# Patient Record
Sex: Female | Born: 1950 | Race: White | Hispanic: No | Smoking: Former smoker
Health system: Southern US, Community
[De-identification: ages and names within clinical notes are randomized; demographics above are authoritative.]

## PROBLEM LIST (undated history)

## (undated) DIAGNOSIS — G4700 Insomnia, unspecified: Secondary | ICD-10-CM

## (undated) DIAGNOSIS — E7800 Pure hypercholesterolemia, unspecified: Secondary | ICD-10-CM

## (undated) DIAGNOSIS — F329 Major depressive disorder, single episode, unspecified: Secondary | ICD-10-CM

## (undated) DIAGNOSIS — F419 Anxiety disorder, unspecified: Secondary | ICD-10-CM

## (undated) DIAGNOSIS — K219 Gastro-esophageal reflux disease without esophagitis: Secondary | ICD-10-CM

## (undated) DIAGNOSIS — M4306 Spondylolysis, lumbar region: Secondary | ICD-10-CM

## (undated) DIAGNOSIS — R519 Headache, unspecified: Secondary | ICD-10-CM

## (undated) DIAGNOSIS — G8929 Other chronic pain: Secondary | ICD-10-CM

## (undated) DIAGNOSIS — M545 Low back pain, unspecified: Secondary | ICD-10-CM

## (undated) DIAGNOSIS — K5792 Diverticulitis of intestine, part unspecified, without perforation or abscess without bleeding: Secondary | ICD-10-CM

## (undated) HISTORY — PX: ROTATOR CUFF REPAIR: SHX139

## (undated) HISTORY — DX: Other chronic pain: G89.29

## (undated) HISTORY — DX: Spondylolysis, lumbar region: M43.06

## (undated) HISTORY — DX: Headache, unspecified: R51.9

## (undated) HISTORY — DX: Low back pain, unspecified: M54.50

## (undated) HISTORY — DX: Insomnia, unspecified: G47.00

## (undated) HISTORY — PX: TONSILLECTOMY: SUR1361

## (undated) HISTORY — DX: Major depressive disorder, single episode, unspecified: F32.9

## (undated) HISTORY — DX: Diverticulitis of intestine, part unspecified, without perforation or abscess without bleeding: K57.92

## (undated) HISTORY — DX: Gastro-esophageal reflux disease without esophagitis: K21.9

## (undated) HISTORY — DX: Anxiety disorder, unspecified: F41.9

## (undated) HISTORY — DX: Pure hypercholesterolemia, unspecified: E78.00

---

## 1984-06-12 HISTORY — PX: PLACEMENT OF BREAST IMPLANTS: SHX6334

## 1988-03-12 HISTORY — PX: ABDOMINAL HYSTERECTOMY: SHX81

## 2002-11-03 ENCOUNTER — Ambulatory Visit (HOSPITAL_BASED_OUTPATIENT_CLINIC_OR_DEPARTMENT_OTHER): Admission: RE | Admit: 2002-11-03 | Discharge: 2002-11-03 | Payer: Self-pay | Admitting: Family Medicine

## 2004-12-09 ENCOUNTER — Other Ambulatory Visit: Admission: RE | Admit: 2004-12-09 | Discharge: 2004-12-09 | Payer: Self-pay | Admitting: Family Medicine

## 2004-12-29 ENCOUNTER — Ambulatory Visit (HOSPITAL_COMMUNITY): Admission: RE | Admit: 2004-12-29 | Discharge: 2004-12-29 | Payer: Self-pay | Admitting: Gastroenterology

## 2004-12-29 ENCOUNTER — Encounter (INDEPENDENT_AMBULATORY_CARE_PROVIDER_SITE_OTHER): Payer: Self-pay | Admitting: *Deleted

## 2006-06-28 ENCOUNTER — Emergency Department (HOSPITAL_COMMUNITY): Admission: EM | Admit: 2006-06-28 | Discharge: 2006-06-29 | Payer: Self-pay | Admitting: Emergency Medicine

## 2007-06-18 ENCOUNTER — Ambulatory Visit (HOSPITAL_BASED_OUTPATIENT_CLINIC_OR_DEPARTMENT_OTHER): Admission: RE | Admit: 2007-06-18 | Discharge: 2007-06-18 | Payer: Self-pay | Admitting: Orthopaedic Surgery

## 2007-07-05 ENCOUNTER — Other Ambulatory Visit: Admission: RE | Admit: 2007-07-05 | Discharge: 2007-07-05 | Payer: Self-pay | Admitting: Family Medicine

## 2008-03-18 ENCOUNTER — Other Ambulatory Visit: Admission: RE | Admit: 2008-03-18 | Discharge: 2008-03-18 | Payer: Self-pay | Admitting: Obstetrics and Gynecology

## 2008-06-12 HISTORY — PX: ABDOMINOPLASTY: SUR9

## 2010-10-25 NOTE — Op Note (Signed)
[name], Destiny Duffy               ACCOUNT NO.:  [id number]   MEDICAL RECORD NO.:  [id number]          PATIENT TYPE:  AMB   LOCATION:  DSC                          FACILITY:  [facility]   PHYSICIAN:  Lubertha Basque. Dalldorf, M.D.DATE OF BIRTH:  [birth year]   DATE OF PROCEDURE:  06/18/2007  DATE OF DISCHARGE:                               OPERATIVE REPORT   PREOPERATIVE DIAGNOSES:  1. Right shoulder impingement.  2. Right shoulder acromioclavicular degeneration.   POSTOPERATIVE DIAGNOSES:  1. Right shoulder impingement.  2. Right shoulder acromioclavicular degeneration.   PROCEDURE:  1. Right shoulder arthroscopic acromioplasty.  2. Right shoulder arthroscopic acromioclavicular resection.  3. Right shoulder arthroscopic debridement.   ANESTHESIA:  General and block.   ATTENDING SURGEON:  Lubertha Basque. Jerl Santos, M.D.   ASSISTANT:  Lindwood Qua, P.A.   INDICATIONS FOR PROCEDURE:  The patient is a 60 year old woman with a  very long history of right dominant shoulder pain.  This stems from a  work related injury.  She has been treated with injections and therapy  and activity restriction but persists with difficulty.  By MRI scan she  has some intrasubstance partial-thickness tearing of the cuff and things  consistent with impingement.  She has pain which limits her ability to  use this arm and to rest and she is offered an arthroscopy.  Informed  operative consent was obtained after discussion of possible  complications of reaction to anesthesia and infection.   SUMMARY OF FINDINGS AND PROCEDURE:  Under general anesthesia and a block  an arthroscopy right shoulder was performed.  The glenohumeral joint  showed no degenerative changes and the biceps tendon appeared normal.  The rotator cuff had some partial-thickness tearing seen from below.  In  the subacromial space the rotator cuff appeared benign after a thorough  bursectomy.  She did have a prominent subacromial morphology addressed  with acromioplasty back to a flat surface.  There was also bone-on-bone  contact at the [facility] joint and this was addressed with a formal AC  decompression.   DESCRIPTION OF PROCEDURE:  The patient was taken to the operating suite  where general anesthetic was applied without difficulty.  She was  positioned in beach-chair position and prepped, draped in normal sterile  fashion.  After the administration of preop IV Kefzol an arthroscopy  right shoulder was performed through a total of three portals.  Findings  were as noted above and procedure consisted of acromioplasty, followed  by [facility] resection.  We performed the acromioplasty with the bur in the  lateral position following transfer of the bur to posterior position.  The [facility] resection was performed through lateral and anterior portals.  I  debrided the partial-thickness cuff tearing seen from below.  A thorough  bursectomy was done but no significant cuff tear could be found.  The  shoulder was thoroughly irrigated followed by placement of some simple  sutures to reapproximate portals loosely.  Adaptic was applied followed  by dry gauze and tape.  Estimated blood loss and intraoperative fluids  can be obtained from anesthesia records.   DISPOSITION:  The patient  was extubated in the operating room and taken  to recovery in stable addition.  She was to go home same-day and follow  up in the office in less than a week.  I will contact her by phone  tonight.      Lubertha Basque Jerl Santos, M.D.  Electronically Signed     PGD/MEDQ  D:  06/18/2007  T:  06/18/2007  Job:  [id number]

## 2010-10-28 NOTE — Op Note (Signed)
NAMECHELE, CORNELL               ACCOUNT NO.:  [id number]   MEDICAL RECORD NO.:  [id number]          PATIENT TYPE:  AMB   LOCATION:  ENDO                         FACILITY[facility]   PHYSICIAN:  Petra Kuba, M.D.    DATE OF BIRTH:  Sep 10, 1950   DATE OF PROCEDURE:  12/29/2004  DATE OF DISCHARGE:                                 OPERATIVE REPORT   PROCEDURE:  Colonoscopy.   ENDOSCOPIST:  Petra Kuba, M.D.   INDICATIONS:  Father with colon polyps.  Due for colonic screening. Consent  signed after risks, benefits, methods, options thoroughly discussed per  nurse in the office.   MEDICINES USED:  Demerol 80 mg, Versed 8 mg.   DESCRIPTION OF PROCEDURE:  Rectal inspection is pertinent for external  hemorrhoids.  Small digital exam was negative. Video pediatric adjustable  colonoscope was inserted inferiorly and easily advanced around the colon to  the cecum.  This did require some abdominal pressure but no position  changes.  On insertion, some left-sided diverticula were seen but no other  abnormalities.  The cecum was identified by the appendiceal orifice and  ileocecal valve.  Scope was slowly withdrawn.  The prep was adequate.  There  was some liquid stool that required washing and suctioning.  On slow  withdrawal through the colon, the cecum and descending were normal.  In the  more distal transverse, two tiny polyps were seen were hot biopsied x1.  A  small slightly pedunculated polyp was seen and snare electrocautery applied  and the polyp was suctioned through the scope and collected in the trap.  This was put in the same container with the other two 32 hot biopsies and  just around the splenic flexure in the proximal descending another tiny  polyp seen and was hot biopsied and put in the same container.  The scope  was withdrawn.  Diverticula were confirmed on the left side. In the distal  sigmoid and rectum, a few probable hyperplastic polyps were seen.  A few  were hot  biopsied.  A couple were cold biopsied and were all put in the  second container.  Anorectal pull-through and retroflexion confirmed some  small hemorrhoids. The scope was straightened and readvanced a short ways up  the left side of the colon.  Air was suctioned and scope removed.  The  patient tolerated the procedure well.  There was no obvious immediate  complication.   ENDOSCOPIC DIAGNOSIS:  1.  Internal and external hemorrhoids.  2.  Left-sided diverticula.  3.  Rectal and distal sigmoid hyperplastic appearing polyps hot and cold      biopsied.  4.  Three tiny to small distal transverse proximal descending polyps hot      biopsied and small distal transverse polyp snared.  5.  Otherwise within normal limits to the cecum.   PLAN:  Await pathology to determine future colonic screening.  Happy to see  back p.r.n.  Otherwise return care to Dr. _______ for the customary health  care maintenance to include yearly rectal as guaiacs.       MEM/MEDQ  D:  12/29/2004  T:  12/29/2004  Job:  [id number]   cc:   Sigmund Hazel, M.D.  [address]  [address] [place], [place] [address]  Fax: [phone]

## 2017-01-29 DIAGNOSIS — M545 Low back pain: Secondary | ICD-10-CM | POA: Diagnosis not present

## 2017-02-20 DIAGNOSIS — K219 Gastro-esophageal reflux disease without esophagitis: Secondary | ICD-10-CM | POA: Diagnosis not present

## 2017-02-20 DIAGNOSIS — M5441 Lumbago with sciatica, right side: Secondary | ICD-10-CM | POA: Diagnosis not present

## 2017-02-20 DIAGNOSIS — F339 Major depressive disorder, recurrent, unspecified: Secondary | ICD-10-CM | POA: Diagnosis not present

## 2017-02-20 DIAGNOSIS — F5101 Primary insomnia: Secondary | ICD-10-CM | POA: Diagnosis not present

## 2017-03-05 DIAGNOSIS — M7061 Trochanteric bursitis, right hip: Secondary | ICD-10-CM | POA: Diagnosis not present

## 2017-03-14 DIAGNOSIS — M7061 Trochanteric bursitis, right hip: Secondary | ICD-10-CM | POA: Diagnosis not present

## 2017-03-26 DIAGNOSIS — Z0000 Encounter for general adult medical examination without abnormal findings: Secondary | ICD-10-CM | POA: Diagnosis not present

## 2017-03-27 DIAGNOSIS — M545 Low back pain: Secondary | ICD-10-CM | POA: Diagnosis not present

## 2017-04-02 DIAGNOSIS — F339 Major depressive disorder, recurrent, unspecified: Secondary | ICD-10-CM | POA: Diagnosis not present

## 2017-04-02 DIAGNOSIS — K219 Gastro-esophageal reflux disease without esophagitis: Secondary | ICD-10-CM | POA: Diagnosis not present

## 2017-04-02 DIAGNOSIS — F5101 Primary insomnia: Secondary | ICD-10-CM | POA: Diagnosis not present

## 2017-04-02 DIAGNOSIS — M5441 Lumbago with sciatica, right side: Secondary | ICD-10-CM | POA: Diagnosis not present

## 2017-04-04 DIAGNOSIS — H25012 Cortical age-related cataract, left eye: Secondary | ICD-10-CM | POA: Diagnosis not present

## 2017-04-04 DIAGNOSIS — H52203 Unspecified astigmatism, bilateral: Secondary | ICD-10-CM | POA: Diagnosis not present

## 2017-04-04 DIAGNOSIS — H2511 Age-related nuclear cataract, right eye: Secondary | ICD-10-CM | POA: Diagnosis not present

## 2017-04-04 DIAGNOSIS — H5213 Myopia, bilateral: Secondary | ICD-10-CM | POA: Diagnosis not present

## 2017-04-06 DIAGNOSIS — M7061 Trochanteric bursitis, right hip: Secondary | ICD-10-CM | POA: Diagnosis not present

## 2018-06-12 HISTORY — PX: ESOPHAGOGASTRODUODENOSCOPY: SHX1529

## 2018-06-12 HISTORY — PX: COLONOSCOPY: SHX174

## 2019-04-17 ENCOUNTER — Encounter: Payer: Self-pay | Admitting: *Deleted

## 2019-04-21 ENCOUNTER — Encounter: Payer: Self-pay | Admitting: Neurology

## 2019-04-21 ENCOUNTER — Ambulatory Visit: Payer: Self-pay | Admitting: Neurology

## 2019-06-18 ENCOUNTER — Ambulatory Visit: Payer: Medicare Other | Admitting: Neurology

## 2019-07-14 ENCOUNTER — Telehealth (INDEPENDENT_AMBULATORY_CARE_PROVIDER_SITE_OTHER): Payer: Self-pay | Admitting: Neurology

## 2019-07-14 DIAGNOSIS — Z5329 Procedure and treatment not carried out because of patient's decision for other reasons: Secondary | ICD-10-CM

## 2019-07-14 NOTE — Progress Notes (Signed)
No-showed appointment with Dr. Lucia Gaskins (and previously with Dr. Epimenio Foot)

## 2019-07-15 ENCOUNTER — Encounter: Payer: Self-pay | Admitting: Neurology

## 2019-10-08 ENCOUNTER — Ambulatory Visit (INDEPENDENT_AMBULATORY_CARE_PROVIDER_SITE_OTHER): Payer: Medicare Other | Admitting: Otolaryngology

## 2019-10-08 ENCOUNTER — Other Ambulatory Visit: Payer: Self-pay

## 2019-10-08 ENCOUNTER — Encounter (INDEPENDENT_AMBULATORY_CARE_PROVIDER_SITE_OTHER): Payer: Self-pay | Admitting: Otolaryngology

## 2019-10-08 VITALS — Temp 97.3°F

## 2019-10-08 DIAGNOSIS — J310 Chronic rhinitis: Secondary | ICD-10-CM | POA: Diagnosis not present

## 2019-10-08 DIAGNOSIS — J342 Deviated nasal septum: Secondary | ICD-10-CM

## 2019-10-08 NOTE — Progress Notes (Signed)
HPI: Destiny Duffy is a 69 y.o. female who presents is referred by her PCP for evaluation of chronic nasal sinus symptoms that she has had for years.  She complains of chronic nasal obstruction as well as a lot of drainage from her nose.  The nasal congestion comes and goes.  She also complains of pressure in her sinuses that comes and goes.  She has not seen an allergist recently.  She also complains of congestion in her ears.  She is presently using Flonase in the mornings and Claritin..  She is moderately congested today.  Past Medical History:  Diagnosis Date  . Anxiety   . Chronic low back pain   . Diverticulitis   . GERD (gastroesophageal reflux disease)   . Headache   . Hypercholesteremia   . Insomnia   . Lumbar spondylolysis   . Major depression, chronic     Social History   Socioeconomic History  . Marital status: Divorced    Spouse name: Not on file  . Number of children: Not on file  . Years of education: Not on file  . Highest education level: Not on file  Occupational History  . Not on file  Tobacco Use  . Smoking status: Former Smoker    Packs/day: 0.50    Years: 37.00    Pack years: 18.50    Types: Cigarettes    Start date: 16    Quit date: 04/30/2006    Years since quitting: 13.4  . Smokeless tobacco: Never Used  Substance and Sexual Activity  . Alcohol use: Yes  . Drug use: Not on file  . Sexual activity: Not on file  Other Topics Concern  . Not on file  Social History Narrative  . Not on file   Social Determinants of Health   Financial Resource Strain:   . Difficulty of Paying Living Expenses:   Food Insecurity:   . Worried About Programme researcher, broadcasting/film/video in the Last Year:   . Barista in the Last Year:   Transportation Needs:   . Freight forwarder (Medical):   Marland Kitchen Lack of Transportation (Non-Medical):   Physical Activity:   . Days of Exercise per Week:   . Minutes of Exercise per Session:   Stress:   . Feeling of Stress :   Social  Connections:   . Frequency of Communication with Friends and Family:   . Frequency of Social Gatherings with Friends and Family:   . Attends Religious Services:   . Active Member of Clubs or Organizations:   . Attends Banker Meetings:   Marland Kitchen Marital Status:    Family History  Problem Relation Age of Onset  . Heart disease Brother   . Diabetes Maternal Grandmother    Allergies  Allergen Reactions  . Aspirin Nausea And Vomiting  . Codeine Nausea And Vomiting   Prior to Admission medications   Medication Sig Start Date End Date Taking? Authorizing Provider  baclofen (LIORESAL) 10 MG tablet Take 10 mg by mouth 3 (three) times daily.   Yes [provider]  DULoxetine (CYMBALTA) 60 MG capsule Take 60 mg by mouth daily.   Yes [provider]  Eszopiclone 3 MG TABS Take 3 mg by mouth at bedtime. Take immediately before bedtime   Yes [provider]  fluticasone (FLONASE) 50 MCG/ACT nasal spray Place into both nostrils daily.   Yes [provider]  gabapentin (NEURONTIN) 100 MG capsule Take 100 mg by mouth  3 (three) times daily.   Yes [provider]  omeprazole (PRILOSEC) 40 MG capsule Take 40 mg by mouth 2 (two) times daily.   Yes [provider]  valACYclovir (VALTREX) 1000 MG tablet Take 1,000 mg by mouth as needed. X 1 dose   Yes [provider]  vitamin B-12 (CYANOCOBALAMIN) 500 MCG tablet Take 500 mcg by mouth daily.   Yes [provider]     Positive ROS: Otherwise negative  All other systems have been reviewed and were otherwise negative with the exception of those mentioned in the HPI and as above.  Physical Exam: Constitutional: Alert, well-appearing, no acute distress Ears: External ears without lesions or tenderness.  Ear canals are clear bilaterally.  TMs are clear bilaterally with good mobility on pneumatic otoscopy and no middle ear effusion noted. Nasal: External nose without lesions.  Septum is deviated to the left.  Mild rhinitis bilaterally with clear mucus discharge.  Both middle meatus regions are clear with no evidence of active infection.  No polyps noted..  Oral: Lips and gums without lesions. Tongue and palate mucosa without lesions. Posterior oropharynx clear. Neck: No palpable adenopathy or masses Respiratory: Breathing comfortably  Skin: No facial/neck lesions or rash noted.  Procedures  Assessment: Chronic rhinitis with probable allergic rhinitis. 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 Clear ears on exam.  Plan: Recommended use of Flonase or Nasacort 2 sprays each nostril at night. Also prescribed use of azelastine 1 spray each nostril twice daily and saline rinses during the daytime as needed nasal drainage and suggested trying the xlear brand. If she does not get adequate relief with maximal medical therapy could consider surgical options. She will call us back in 1 month if she continues to have persistent problems. She might also benefit by seeing an allergist and suggested allergy and asthma clinic.   Radene Journey, MD   CC:

## 2019-10-17 ENCOUNTER — Ambulatory Visit (INDEPENDENT_AMBULATORY_CARE_PROVIDER_SITE_OTHER): Payer: Medicare Other | Admitting: Allergy

## 2019-10-17 ENCOUNTER — Other Ambulatory Visit: Payer: Self-pay

## 2019-10-17 ENCOUNTER — Telehealth: Payer: Self-pay | Admitting: Allergy

## 2019-10-17 ENCOUNTER — Encounter: Payer: Self-pay | Admitting: Allergy

## 2019-10-17 VITALS — BP 110/70 | HR 75 | Temp 97.6°F | Resp 16 | Ht 65.0 in | Wt 194.4 lb

## 2019-10-17 DIAGNOSIS — H1013 Acute atopic conjunctivitis, bilateral: Secondary | ICD-10-CM | POA: Diagnosis not present

## 2019-10-17 DIAGNOSIS — J310 Chronic rhinitis: Secondary | ICD-10-CM | POA: Diagnosis not present

## 2019-10-17 MED ORDER — XHANCE 93 MCG/ACT NA EXHU: INHALANT_SUSPENSION | NASAL | 5 refills | Status: AC

## 2019-10-17 NOTE — Patient Instructions (Addendum)
 -  Environmental allergy skin testing is non-reactive today thus will obtain environmental panel via blood work today  -Stop Claritin.  Recommend use of long-acting antihistamine Zyrtec 10 mg, Xyzal 5 mg or Allegra 180 mg daily.  -Continue nasal antihistamine, Astelin 2 sprays each nostril twice a day at this time  -We will have you try XHANCE nasal spray device.  This nasal spray contains fluticasone which is found in Flonase however is a higher strength.  XHANCE allows for deeper deposition of the medications into your sinuses for better impact and control of your congestion as well as ear and headache symptoms.  Use XHANCE 2 sprays each nostril twice a day at this time.  -Continue to perform nasal saline rinses as much as possible.  Use either distilled water or boiled water and bring down to room temperature prior to use.  -For itchy, watery eyes can use over-the-counter Pataday or Pataday extra strength 1 drop each eye daily as needed  -If the above medications are not effective enough consider adding Singulair  -If medication management is not effective then consider allergen immunotherapy (pending sensitivity on lab work).  Follow-up in 3 to 4 months or sooner if needed

## 2019-10-17 NOTE — Telephone Encounter (Signed)
Patient called and wants Xhance sent to her local pharmacy, [facility] on [address] in [place] instead of [employer].  Please advise.

## 2019-10-17 NOTE — Progress Notes (Signed)
New Patient Note  RE: CINTIA GLEED MRN: [id number] DOB: 09-19-1950 Date of Office Visit: 10/17/2019  Referring provider: Dillard Cannon, MD; Worthy Rancher, MD Primary care provider: Worthy Rancher, MD  Chief Complaint: allergies  History of present illness: KALEB LINQUIST is a 69 y.o. female presenting today for consultation for chronic rhinitis.   She reports year-round symptoms of feeling "clogged up", runny nose, nasal congestion, headaches, ears clogged, pressure behind eyes, itchy/watery eyes, sneezing, throat clearing.   She has tried Claritin daily for past 8 months, Flonase 1 spray each nostril at night for the past 8 months, Astelin 1 spray each nostril twice a day for couple days (she just received this nasal spray).  She has performed nasal saline rinses about once week on average and reports is not a big fan of this technique.  She states she feels the same on and off the Claritin for past 3 days.   She has seen Dr. Ezzard Standing with ENT for chronic rhinitis with most recent visit on 10/08/2019.  On exam she was noted to have septal deviation to the left.  She was recommended to use Flonase or Nasacort as well as azelastine and to perform saline rinses.  It was recommended if she does not have adequate relief with medical therapy that she could consider surgical options.  No history of eczema, food allergy or asthma.   Review of systems: Review of Systems  Constitutional: Negative.   HENT:       See HPI  Eyes:       See HPI  Respiratory: Negative.   Cardiovascular: Negative.   Gastrointestinal: Negative.   Musculoskeletal: Negative.   Skin: Negative.   Neurological:       See HPI    All other systems negative unless noted above in HPI  Past medical history: Past Medical History:  Diagnosis Date  . Anxiety   . Chronic low back pain   . Diverticulitis   . GERD (gastroesophageal reflux disease)   . Headache   . Hypercholesteremia   . Insomnia   .  Lumbar spondylolysis   . Major depression, chronic     Past surgical history: Past Surgical History:  Procedure Laterality Date  . ABDOMINAL HYSTERECTOMY  03/1988  . ABDOMINOPLASTY  2010  . COLONOSCOPY  2020  . ESOPHAGOGASTRODUODENOSCOPY  2020  . PLACEMENT OF BREAST IMPLANTS Bilateral 1986  . ROTATOR CUFF REPAIR  2007.2009  . TONSILLECTOMY      Family history:  Family History  Problem Relation Age of Onset  . Heart disease Brother   . Diabetes Maternal Grandmother     Social history: Lives in an apartment with carpeting in bedroom with dog and cat in the home.  No concern for water damage, mildew or roaches in the home.  Does not work currently.   Socioeconomic History  . Marital status: Divorced  Occupational History  Tobacco Use  . Smoking status: Former Smoker    Packs/day: 0.50    Years: 37.00    Pack years: 18.50    Types: Cigarettes    Start date: 84    Quit date: 04/30/2006    Years since quitting: 13.4  . Smokeless tobacco: Never Used    Medication List: Current Outpatient Medications  Medication Sig Dispense Refill  . azelastine (ASTELIN) 0.1 % nasal spray Place 2 sprays into both nostrils 2 (two) times daily.    . baclofen (LIORESAL) 10 MG tablet Take 10 mg by mouth 3 (three)  times daily.    . DULoxetine (CYMBALTA) 60 MG capsule Take 60 mg by mouth daily.    . Eszopiclone 3 MG TABS Take 3 mg by mouth at bedtime. Take immediately before bedtime    . fluticasone (FLONASE) 50 MCG/ACT nasal spray Place into both nostrils daily.    Marland Kitchen gabapentin (NEURONTIN) 100 MG capsule Take 100 mg by mouth 3 (three) times daily.    Marland Kitchen omeprazole (PRILOSEC) 40 MG capsule Take 40 mg by mouth 2 (two) times daily.    . valACYclovir (VALTREX) 1000 MG tablet Take 1,000 mg by mouth as needed. X 1 dose    . vitamin B-12 (CYANOCOBALAMIN) 500 MCG tablet Take 500 mcg by mouth daily.     No current facility-administered medications for this visit.    Known medication  allergies: Allergies  Allergen Reactions  . Aspirin Nausea And Vomiting  . Codeine Nausea And Vomiting     Physical examination: Blood pressure 110/70, pulse 75, temperature 97.6 F (36.4 C), temperature source Temporal, resp. rate 16, height 5\' 5"  (1.651 m), weight 194 lb 6.4 oz (88.2 kg), SpO2 95 %.  General: Alert, interactive, in no acute distress. HEENT: PERRLA, TMs pearly gray, turbinates moderately edematous with clear discharge with leftward deviation of the septum, post-pharynx non erythematous. Neck: Supple without lymphadenopathy. Lungs: Clear to auscultation without wheezing, rhonchi or rales. {no increased work of breathing. CV: Normal S1, S2 without murmurs. Abdomen: Nondistended, nontender. Skin: Warm and dry, without lesions or rashes. Extremities:  No clubbing, cyanosis or edema. Neuro:   Grossly intact.  Diagnositics/Labs:  Allergy testing: Environmental allergy skin prick testing is non-reactive including histamine. Allergy testing results were read and interpreted by provider, documented by clinical staff.   Assessment and plan:   Rhinitis with conjunctivitis, presumed allergic  -Environmental allergy skin testing is non-reactive today thus will obtain environmental panel via blood work today  -Stop Claritin.  Recommend use of long-acting antihistamine Zyrtec 10 mg, Xyzal 5 mg or Allegra 180 mg daily.  -Continue nasal antihistamine, Astelin 2 sprays each nostril twice a day at this time  -We will have you try XHANCE nasal spray device.  This nasal spray contains fluticasone which is found in Flonase however is a higher strength.  XHANCE allows for deeper deposition of the medications into your sinuses for better impact and control of your congestion as well as ear and headache symptoms.  Use XHANCE 2 sprays each nostril twice a day at this time.  -Continue to perform nasal saline rinses as much as possible.  Use either distilled water or boiled water and bring  down to room temperature prior to use.  -For itchy, watery eyes can use over-the-counter Pataday or Pataday extra strength 1 drop each eye daily as needed  -If the above medications are not effective enough consider adding Singulair  -If medication management is not effective then consider allergen immunotherapy (pending sensitivity on lab work).    Follow-up in 3 to 4 months or sooner if needed  I appreciate the opportunity to take part in Aston's care. Please do not hesitate to contact me with questions.  Sincerely,   Prudy Feeler, MD Allergy/Immunology [facility]

## 2019-10-17 NOTE — Telephone Encounter (Signed)
Called patient back and explained the reason why we send Xhance to [facility]. Patient verbalized understanding.

## 2019-10-23 LAB — ALLERGENS W/TOTAL IGE AREA 2

## 2019-11-04 ENCOUNTER — Other Ambulatory Visit (INDEPENDENT_AMBULATORY_CARE_PROVIDER_SITE_OTHER): Payer: Self-pay

## 2019-11-04 DIAGNOSIS — J3489 Other specified disorders of nose and nasal sinuses: Secondary | ICD-10-CM

## 2019-11-20 ENCOUNTER — Ambulatory Visit
Admission: RE | Admit: 2019-11-20 | Discharge: 2019-11-20 | Disposition: A | Discharge status: Home / Self Care | Payer: Medicare Other | Source: Ambulatory Visit | Attending: Otolaryngology | Admitting: Otolaryngology

## 2019-11-20 ENCOUNTER — Other Ambulatory Visit: Payer: Self-pay

## 2019-11-20 DIAGNOSIS — J3489 Other specified disorders of nose and nasal sinuses: Secondary | ICD-10-CM

## 2019-11-26 ENCOUNTER — Other Ambulatory Visit (INDEPENDENT_AMBULATORY_CARE_PROVIDER_SITE_OTHER): Payer: Self-pay

## 2020-01-20 NOTE — Telephone Encounter (Signed)
Prior authorization for Xhance submitted on covermymeds. Currently pending approval/denial.  

## 2020-01-21 NOTE — Telephone Encounter (Signed)
Bummer.  Can we provide with samples?

## 2020-01-21 NOTE — Telephone Encounter (Signed)
Patient's insurance denied coverage of Xhance. We will need to switch to something like Flonase, azelastine, Nasonex, etc?

## 2020-01-21 NOTE — Telephone Encounter (Signed)
I could get her a couple of weeks worth.

## 2020-02-23 ENCOUNTER — Other Ambulatory Visit (INDEPENDENT_AMBULATORY_CARE_PROVIDER_SITE_OTHER): Payer: Self-pay | Admitting: Otolaryngology

## 2021-04-19 IMAGING — CT CT MAXILLOFACIAL W/O CM
1 series · 15 of 30 positions shown, 19 images · non-contrast
Comparison: Head CT 03/03/2019.

CLINICAL DATA: 69-year-old female with chronic or recurrent sinus
issues, progressed from last year. Nasal obstruction.

EXAM:
CT MAXILLOFACIAL WITHOUT CONTRAST
TECHNIQUE: Multidetector CT images of the paranasal sinuses were obtained using
the standard protocol without intravenous contrast.

[Series 4: soft tissue · axial · 0.40mm/px · z∈[+732,+837]mm · 15 of 113 slices shown, 19 images]
[im 4/113  brain]
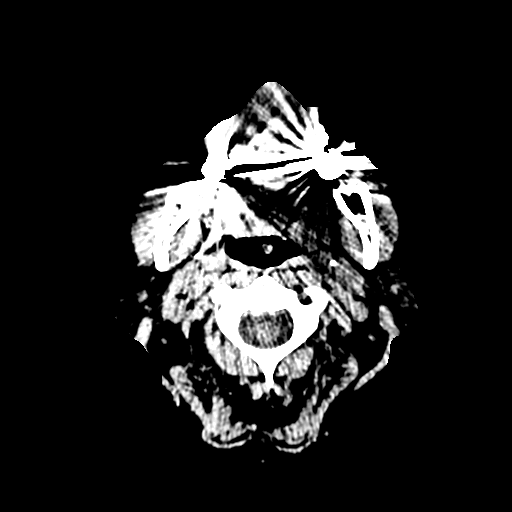
[im 4/113  bone]
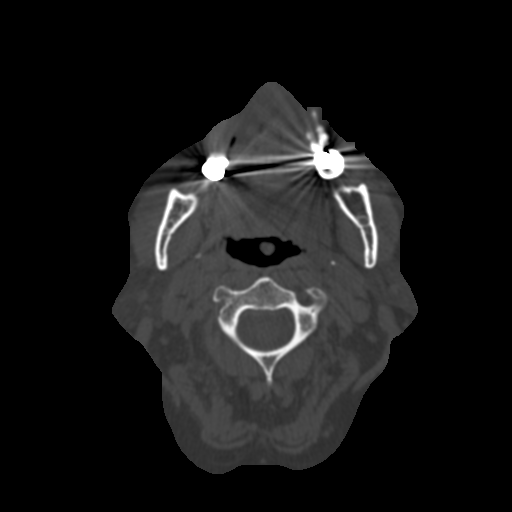
[im 12/113  bone]
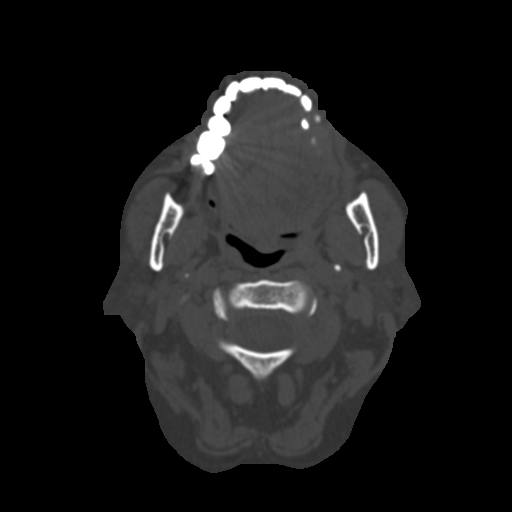
[im 20/113  bone]
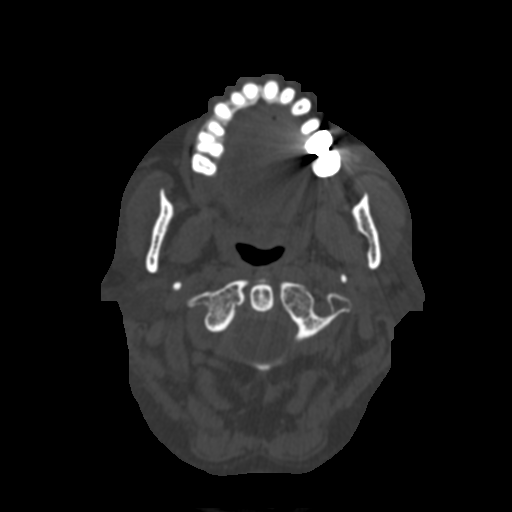
[im 28/113  bone]
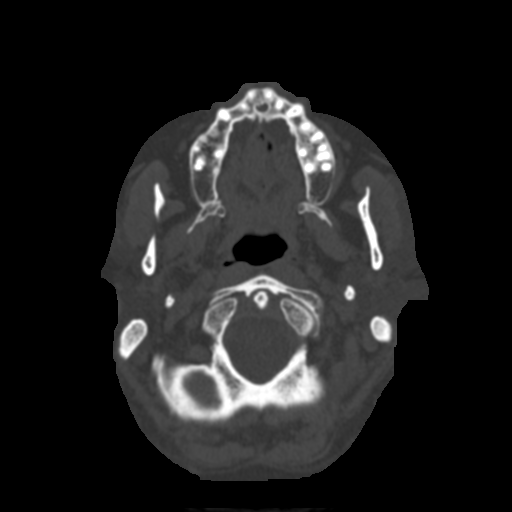
[im 35/113  brain]
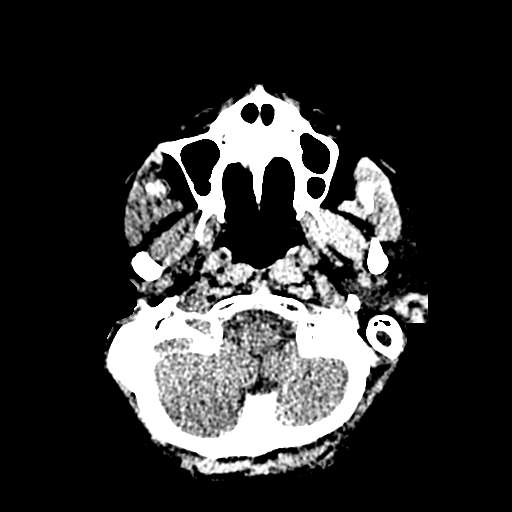
[im 35/113  bone]
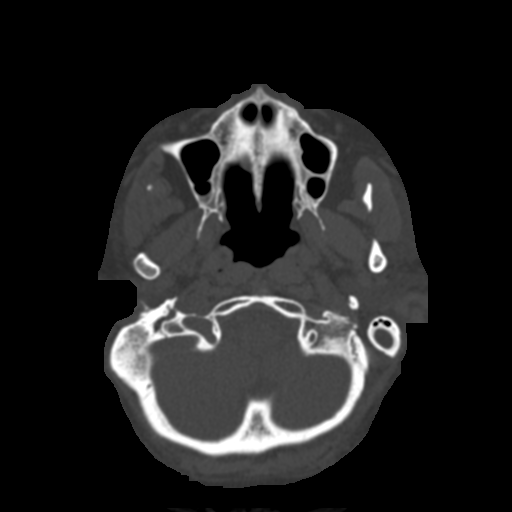
[im 43/113  bone]
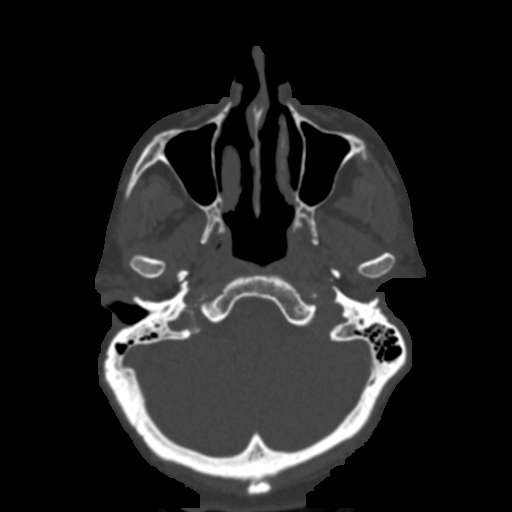
[im 51/113  bone]
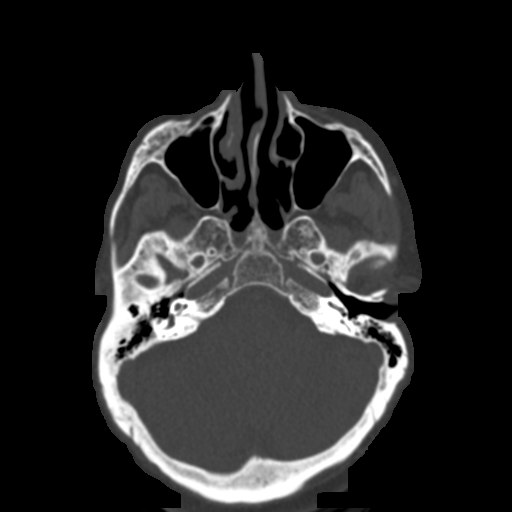
[im 58/113  bone]
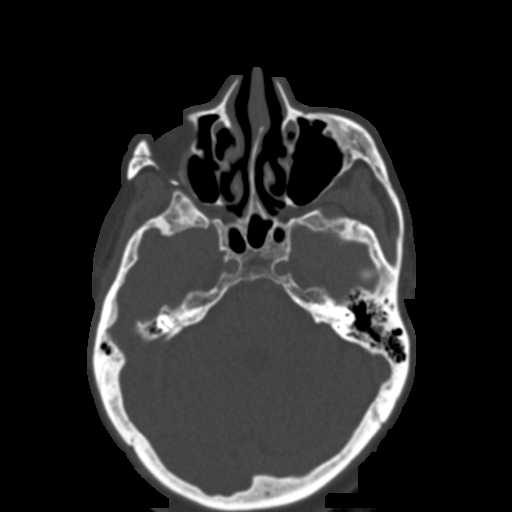
[im 62/113  brain]
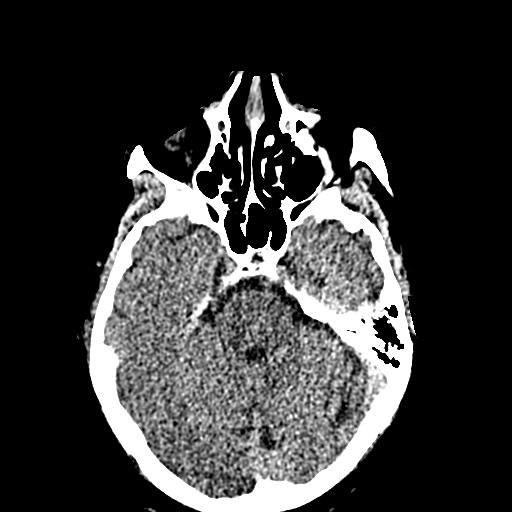
[im 62/113  bone]
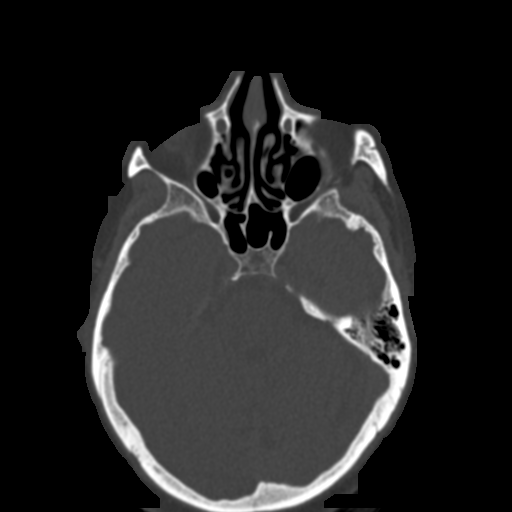
[im 70/113  bone]
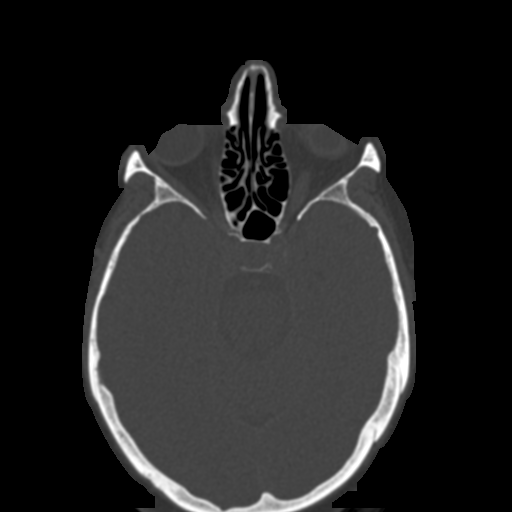
[im 78/113  bone]
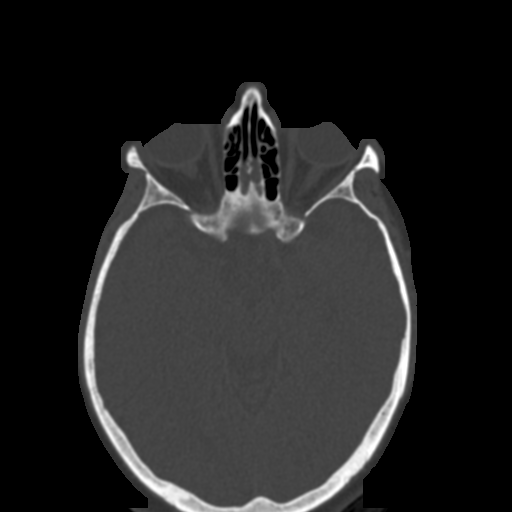
[im 85/113  bone]
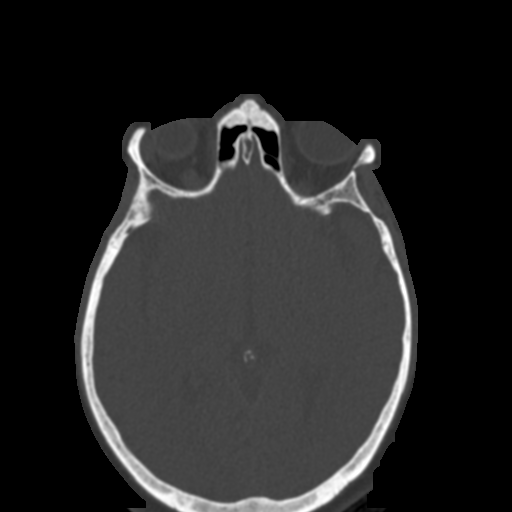
[im 93/113  brain]
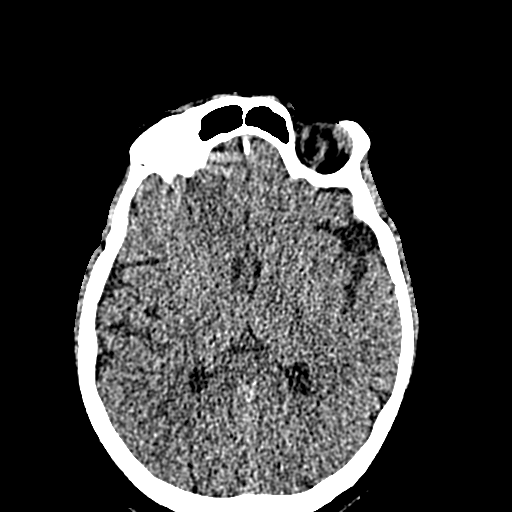
[im 93/113  bone]
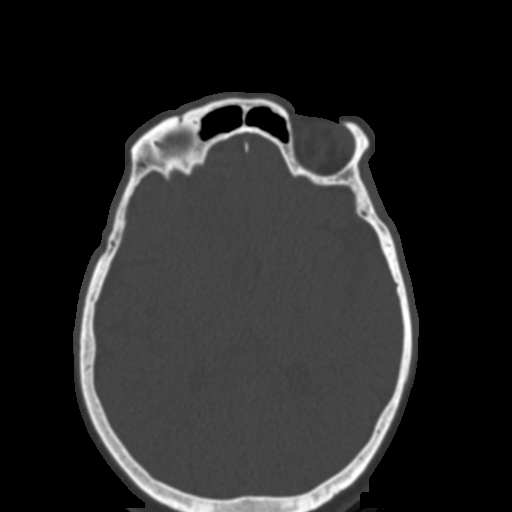
[im 101/113  bone]
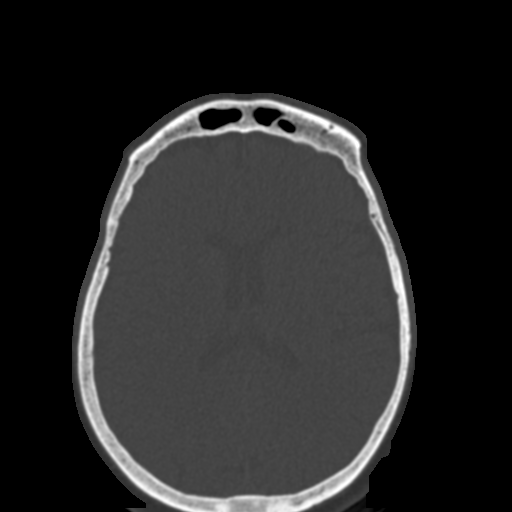
[im 109/113  bone]
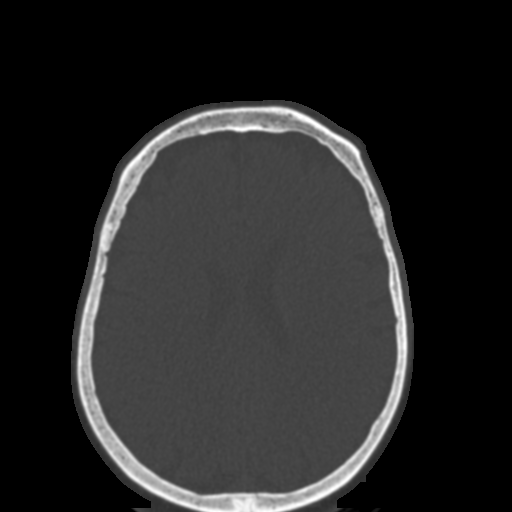

[15 of 30 positions shown; findings below may reference images not displayed]

FINDINGS: Paranasal sinuses:

Frontal: Normally aerated. Patent frontal sinus drainage pathways.

Ethmoid: Mildly hyperplastic.  Normally aerated.

Maxillary: Normally aerated. Incomplete right maxillary septation
(series 3, image 56).

Sphenoid: Well aerated, trace mucosal thickening on the right.
Patent sphenoethmoidal recesses.

Right ostiomeatal unit: Patent (coronal image 28).

Left ostiomeatal unit: Patent (same image).

Nasal passages: Nasal septum is intact. There is mild leftward
septal deviation. Somewhat atrophic appearing nasal cavity mucosa
(coronal image 31). Normally aerated olfactory recesses. Good grip
in intact nasal septum is midline.

Anatomy:

Anterior ethmoidal artery position suspected on coronal image 33
with pneumatization superior to both notches.

Keros type 2 olfactory fossa.

Presellar sphenoid pneumatization pattern.

No clinoid process pneumatization.

Incomplete right maxillary septation (series 3, image 56).

Other: Stable and negative visible noncontrast brain parenchyma.
Postoperative changes again noted to both globes. Otherwise
visualized orbits and scalp soft tissues are within normal limits.
Negative visible noncontrast deep soft tissue spaces of the face.

Tympanic cavities and mastoids are clear. No acute maxillary dental
finding. No acute osseous abnormality identified.
IMPRESSION: 1. Normally aerated paranasal sinuses. Patent sinus drainage
pathways.
2. Mild leftward septal deviation.

## 2021-05-02 ENCOUNTER — Other Ambulatory Visit (HOSPITAL_BASED_OUTPATIENT_CLINIC_OR_DEPARTMENT_OTHER): Payer: Self-pay | Admitting: *Deleted

## 2021-05-02 ENCOUNTER — Other Ambulatory Visit (HOSPITAL_BASED_OUTPATIENT_CLINIC_OR_DEPARTMENT_OTHER): Payer: Self-pay | Admitting: Family Medicine

## 2021-05-02 DIAGNOSIS — J439 Emphysema, unspecified: Secondary | ICD-10-CM

## 2021-05-18 ENCOUNTER — Telehealth (HOSPITAL_BASED_OUTPATIENT_CLINIC_OR_DEPARTMENT_OTHER): Payer: Self-pay
